# Patient Record
Sex: Female | Born: 2005 | Race: White | Hispanic: No | Marital: Single | Smoking: Never smoker
Health system: Southern US, Community
[De-identification: ages and names within clinical notes are randomized; demographics above are authoritative.]

## PROBLEM LIST (undated history)

## (undated) DIAGNOSIS — J210 Acute bronchiolitis due to respiratory syncytial virus: Secondary | ICD-10-CM

## (undated) HISTORY — PX: OTHER SURGICAL HISTORY: SHX169

## (undated) HISTORY — PX: TOOTH EXTRACTION: SUR596

---
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
## 2024-01-05 ENCOUNTER — Encounter: Payer: Self-pay | Admitting: Radiology

## 2024-01-15 ENCOUNTER — Encounter: Admitting: Adult Health
